# Patient Record
Sex: Male | Born: 1950 | Race: White | Hispanic: No | Marital: Married | Smoking: Never smoker
Health system: Southern US, Community
[De-identification: ages and names within clinical notes are randomized; demographics above are authoritative.]

## PROBLEM LIST (undated history)

## (undated) DIAGNOSIS — N400 Enlarged prostate without lower urinary tract symptoms: Secondary | ICD-10-CM

## (undated) DIAGNOSIS — Z972 Presence of dental prosthetic device (complete) (partial): Secondary | ICD-10-CM

## (undated) HISTORY — PX: PROSTATE BIOPSY: SHX241

---

## 2014-09-15 LAB — CBC WITH DIFFERENTIAL/PLATELET
BASOS PCT: 0.4 %
Basophil #: 0 10*3/uL (ref 0.0–0.1)
EOS PCT: 4 %
Eosinophil #: 0.3 10*3/uL (ref 0.0–0.7)
HCT: 44.6 % (ref 40.0–52.0)
HGB: 14.7 g/dL (ref 13.0–18.0)
LYMPHS ABS: 1.6 10*3/uL (ref 1.0–3.6)
Lymphocyte %: 22.9 %
MCH: 30.1 pg (ref 26.0–34.0)
MCHC: 32.9 g/dL (ref 32.0–36.0)
MCV: 91 fL (ref 80–100)
Monocyte #: 0.5 x10 3/mm (ref 0.2–1.0)
Monocyte %: 7.8 %
Neutrophil #: 4.5 10*3/uL (ref 1.4–6.5)
Neutrophil %: 64.9 %
Platelet: 207 10*3/uL (ref 150–440)
RBC: 4.88 10*6/uL (ref 4.40–5.90)
RDW: 14 % (ref 11.5–14.5)
WBC: 7 10*3/uL (ref 3.8–10.6)

## 2014-09-15 LAB — BASIC METABOLIC PANEL
ANION GAP: 7 (ref 7–16)
BUN: 20 mg/dL
Calcium, Total: 8.7 mg/dL — ABNORMAL LOW
Chloride: 104 mmol/L
Co2: 27 mmol/L
Creatinine: 1.24 mg/dL
EGFR (Non-African Amer.): >60
GLUCOSE: 127 mg/dL — AB
Potassium: 4.2 mmol/L
Sodium: 138 mmol/L

## 2014-09-15 LAB — URINALYSIS, COMPLETE
Bacteria: NONE SEEN
GLUCOSE, UR: NEGATIVE mg/dL (ref 0–75)
Leukocyte Esterase: NEGATIVE
Nitrite: NEGATIVE
Ph: 5 (ref 4.5–8.0)
Protein: NEGATIVE
Specific Gravity: 1.02 (ref 1.003–1.030)
Squamous Epithelial: NONE SEEN

## 2014-09-15 LAB — TROPONIN I

## 2014-09-16 ENCOUNTER — Observation Stay: Admit: 2014-09-16 | Disposition: A | Discharge status: Home / Self Care | Payer: Self-pay | Admitting: Internal Medicine

## 2014-09-16 ENCOUNTER — Encounter: Payer: Self-pay | Admitting: Internal Medicine

## 2014-09-16 ENCOUNTER — Encounter: Payer: Self-pay | Admitting: Emergency Medicine

## 2014-09-16 LAB — CBC WITH DIFFERENTIAL/PLATELET
Basophil #: 0 10*3/uL (ref 0.0–0.1)
Basophil %: 0.5 %
EOS ABS: 0 10*3/uL (ref 0.0–0.7)
Eosinophil %: 0.5 %
HCT: 43.8 % (ref 40.0–52.0)
HGB: 14.2 g/dL (ref 13.0–18.0)
LYMPHS PCT: 17.9 %
Lymphocyte #: 1.3 10*3/uL (ref 1.0–3.6)
MCH: 29.4 pg (ref 26.0–34.0)
MCHC: 32.4 g/dL (ref 32.0–36.0)
MCV: 91 fL (ref 80–100)
MONOS PCT: 5.8 %
Monocyte #: 0.4 x10 3/mm (ref 0.2–1.0)
Neutrophil #: 5.5 10*3/uL (ref 1.4–6.5)
Neutrophil %: 75.3 %
Platelet: 204 10*3/uL (ref 150–440)
RBC: 4.82 10*6/uL (ref 4.40–5.90)
RDW: 13.6 % (ref 11.5–14.5)
WBC: 7.2 10*3/uL (ref 3.8–10.6)

## 2014-09-16 LAB — BASIC METABOLIC PANEL
ANION GAP: 4 — AB (ref 7–16)
BUN: 17 mg/dL
CALCIUM: 8.6 mg/dL — AB
Chloride: 113 mmol/L — ABNORMAL HIGH
Co2: 26 mmol/L
Creatinine: 1.01 mg/dL
EGFR (African American): >60
GLUCOSE: 106 mg/dL — AB
Potassium: 4.6 mmol/L
SODIUM: 143 mmol/L

## 2014-09-16 LAB — HEPATIC FUNCTION PANEL A (ARMC)
ALBUMIN: 4.1 g/dL
ALT: 24 U/L
Alkaline Phosphatase: 68 U/L
Bilirubin, Direct: <0.1 mg/dL
Bilirubin,Total: 0.6 mg/dL
SGOT(AST): 22 U/L
TOTAL PROTEIN: 6.6 g/dL

## 2014-09-16 LAB — TROPONIN I

## 2014-09-24 NOTE — Discharge Summary (Signed)
PATIENT NAME:  Jeffery Wagner, Jeffery Wagner MR#:  [id number] DATE OF BIRTH:  Mar 27, 1951  DATE OF ADMISSION:  09/16/2014 DATE OF DISCHARGE:  09/16/2014  ADMITTING DIAGNOSIS:  Syncope.    DISCHARGE DIAGNOSES:   1. Syncope, suspected due to vasovagal nature; negative carotid Dopplers, echocardiogram, telemetry.   2. History of benign prostatic hypertrophy.  CONSULTANTS:  None.   PERTINENT LABORATORIES AND EVALUATIONS:  Echocardiogram of the heart showed normal EF.  Carotid Dopplers were negative.  Troponin x 3 negative.  WBC 7.0, hemoglobin 14.7, platelet count 207,000.  BMP:  Glucose 127, BUN 20, creatinine 1.24, sodium 138, potassium 4.2, chloride 104, CO2 of 27, calcium 8.7.  HOSPITAL COURSE:  Please refer to H and P done by the admitting physician.  Patient is a 64 year old white male who was admitted after a syncopal episode.  Patient was placed under observation and placed on telemetry.  He presented with episode of syncope.  Patient had a carotid Doppler, echocardiogram, which were nonrevealing.  He did not have any orthostatic changes.  Patient also was monitored on telemetry without any significant abnormality.  At this time, patient is doing better, no further symptoms, stable for discharge.  If he has recurrence of symptoms, he needs a Holter monitor.  DISCHARGE MEDICATIONS:  Flomax 0.4 q. daily.  DIET:  Regular.    ACTIVITY:  As tolerated.  FOLLOWUP:  Follow up with primary MD in 1-2 weeks.    TIME SPENT:  Thirty-five minutes on the discharge.   ____________________________ Lafonda Mosses. Posey Pronto, MD shp:kc D: 09/17/2014 14:08:47 ET T: 09/17/2014 16:07:24 ET JOB#: [id number]  cc: Taiyo Kozma H. Posey Pronto, MD, <Dictator> Alric Seton MD ELECTRONICALLY SIGNED 09/22/2014 14:40

## 2014-09-24 NOTE — H&P (Signed)
PATIENT NAME:  Jeffery Wagner, Jeffery Wagner MR#:  [id number] DATE OF BIRTH:  09-Nov-1950  DATE OF ADMISSION:  09/16/2014  REFERRING PHYSICIAN: Conni Slipper, MD  PRIMARY CARE PHYSICIAN: Loyce Dys, MD out of [facility].  CHIEF COMPLAINT: Passed out.   HISTORY OF PRESENT ILLNESS: A 64 year old Caucasian gentleman with a past medical history of BPH, presenting after a syncopal episode. Describes acute onset of symptoms which happened today upon sitting to a standing position. He felt lightheaded. He walked about  10 feet and then passed out with complete loss of consciousness. No head trauma at that time. Total episode lasted 15 to 30 seconds before regaining consciousness. No tongue biting or loss of bowel or bladder function. No postictal state.  No palpitations, chest pain, or further symptomatology other than nausea, which he sustained for about 4 hours in total. He came to the emergency department for further workup and evaluation. Initial laboratory data including EKG within normal limits. ER staff feel the patient should be observed more closely, thus request hospital admission.   REVIEW OF SYSTEMS:  CONSTITUTIONAL: Denies fevers, chills, fatigue, weakness.  EYES: Denies blurred vision, double vision, eye pain.  EARS, NOSE, THROAT: Denies tinnitus, ear pain, hearing loss.  RESPIRATORY: Denies cough, wheeze, shortness of breath.  CARDIOVASCULAR: Denies chest pain, palpitations, edema.  GASTROINTESTINAL: Positive for nausea. Denies vomiting, diarrhea, abdominal pain.  GENITOURINARY: Denies dysuria or hematuria.  ENDOCRINE: Denies nocturia or thyroid problems.  HEMATOLOGIC AND LYMPHATIC: Denies bruising or bleeding.  SKIN: Denies rash or lesions.  MUSCULOSKELETAL: Denies pain in neck, back, shoulder, knees, hips or arthritic symptoms.  NEUROLOGIC: Denies paralysis, paresthesias.  PSYCHIATRIC: Denies anxiety or depressive symptoms.   Otherwise, full review of systems performed by me was  negative.   PAST MEDICAL HISTORY: Includes BPH.   SOCIAL HISTORY: Denies any alcohol, tobacco, or drug usage.   FAMILY HISTORY: Denies any cardiovascular or pulmonary disorders.   ALLERGIES: No known drug allergies.   HOME MEDICATIONS: Include Flomax 0.4 mg p.o. daily.   PHYSICAL EXAMINATION:  VITAL SIGNS: Temperature 97.7, heart rate 58, respirations 18, blood pressure 134/72, saturating 100% on room air. Orthostatics: Lying heart rate 70, blood pressure 122/76; sitting: 138/79, orthostatic negative. Weight 80.7 kg, BMI 25.3.  GENERAL: Well-nourished, well-developed Caucasian gentleman currently in no acute distress.  HEAD: Normocephalic, atraumatic.  EYES: Pupils equal, round, and reactive to light. Extraocular muscles intact. No scleral icterus.  MOUTH: Moist mucosal membranes. Dentition intact. No abscess noted.  EARS, NOSE, AND THROAT: Clear without exudates. No external lesions.  NECK: Supple. No thyromegaly. No nodules. No JVD.  PULMONARY: Clear to auscultation bilaterally without wheeze, rales, or rhonchi. No use of accessory muscles. Good respiratory effort. CHEST: Nontender to palpation.  CARDIOVASCULAR: S1, S2, regular rate and rhythm. No murmurs, rubs, or gallops. No edema. Pedal pulses 2+ bilaterally.  GASTROINTESTINAL: Soft, nontender, nondistended. No masses. Positive bowel sounds. No hepatosplenomegaly.  MUSCULOSKELETAL: No swelling, clubbing, or edema. Range of motion full in all extremities.  NEUROLOGIC: Cranial nerves II through XII intact. No gross neurological deficits. Sensation intact. Reflexes intact.  SKIN: No ulceration, lesions, rash, or cyanosis. Skin warm and dry. Turgor intact.  PSYCHIATRIC: Mood and affect within normal limits. The patient is awake, alert, oriented x 3. Insight and judgment intact.   LABORATORY DATA: Sodium 138, potassium 4.2, chloride 104, bicarbonate 27, BUN 20, creatinine 1.24, glucose 127. Troponin 0.03. WBC of 7, hemoglobin of 14.7,  platelets of 207,000.   EKG performed. No ST or T wave  abnormalities, normal sinus rhythm.   CT head performed: Slight atrophy. No acute intracranial process.   ASSESSMENT AND PLAN: A 64 year old Caucasian gentleman with history of benign prostatic hypertrophy, presenting after a syncopal episode.  1.  Syncope: Admission under observational status, place on telemetry, provide slight intravenous fluid hydration, trend cardiac enzymes x 3.  2.  Benign prostatic hypertrophy. Continue Flomax.  3.  Venous thromboembolism with heparin subcutaneous.   CODE STATUS: The patient is a FULL CODE.  TIME SPENT: 35 minutes.    ____________________________ Jeffery Mose. Hower, MD dkh:TT D: 09/16/2014 03:30:00 ET T: 09/16/2014 04:35:26 ET JOB#: [id number]  cc: Jeffery Mose. Hower, MD, <Dictator> DAVID Woodfin Ganja MD ELECTRONICALLY SIGNED 09/17/2014 2:47

## 2015-10-14 IMAGING — CT CT HEAD WITHOUT CONTRAST
2 series · 14 of 30 positions shown, 16 images · non-contrast
Comparison: None.

CLINICAL DATA: Syncope.

EXAM:
CT HEAD WITHOUT CONTRAST
TECHNIQUE: Contiguous axial images were obtained from the base of the skull
through the vertex without intravenous contrast.

[Series 2: head wo · axial · 0.43mm/px · z∈[-47,+52]mm · 6 of 32 slices shown, 8 images]
[im 5/32  brain]
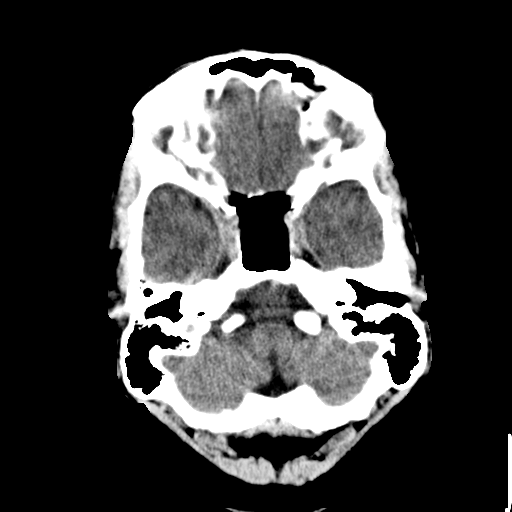
[im 5/32  bone]
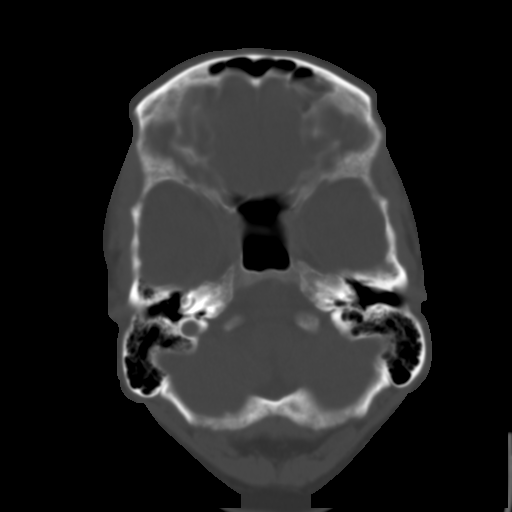
[im 9/32  brain]
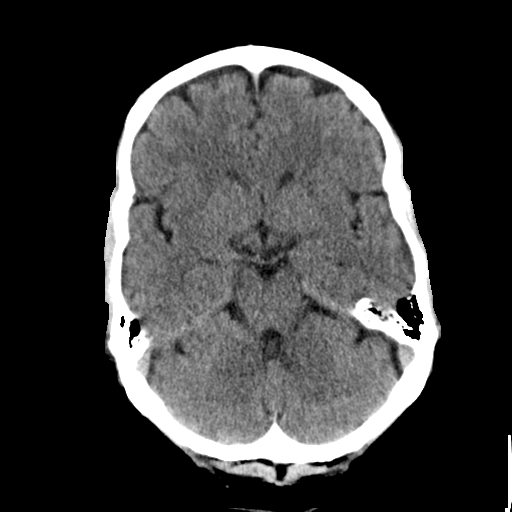
[im 14/32  brain]
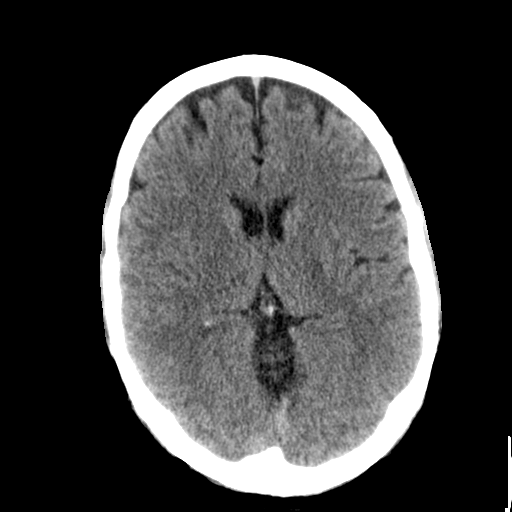
[im 18/32  brain]
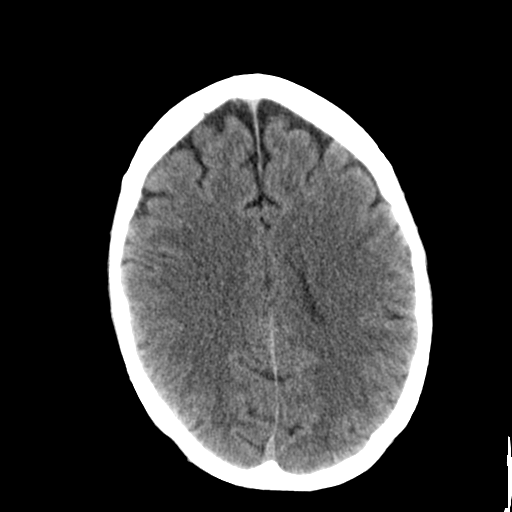
[im 23/32  brain]
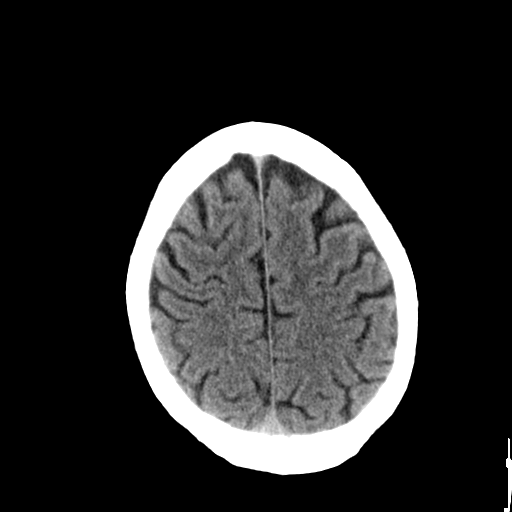
[im 23/32  bone]
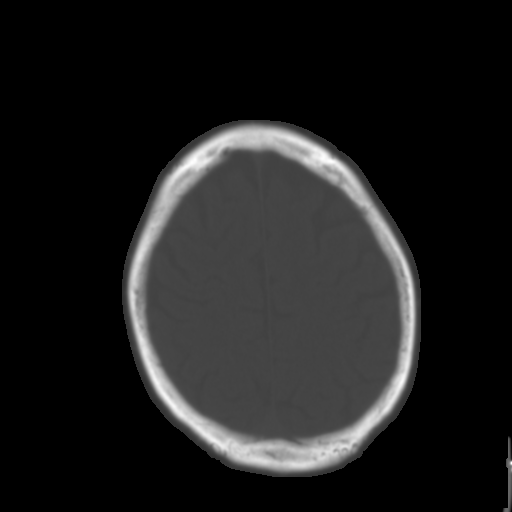
[im 27/32  brain]
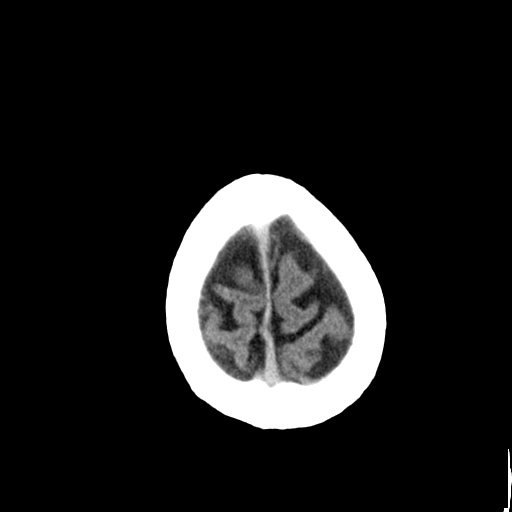

[Series 3: head bone · axial · 0.43mm/px · z∈[-53,+61]mm · 8 of 96 slices shown]
[im 10/96  bone]
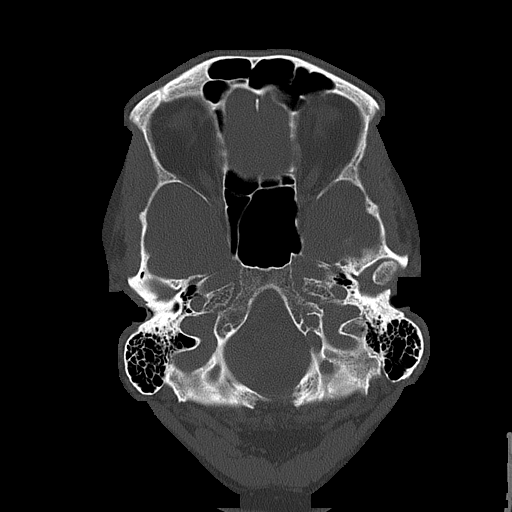
[im 19/96  bone]
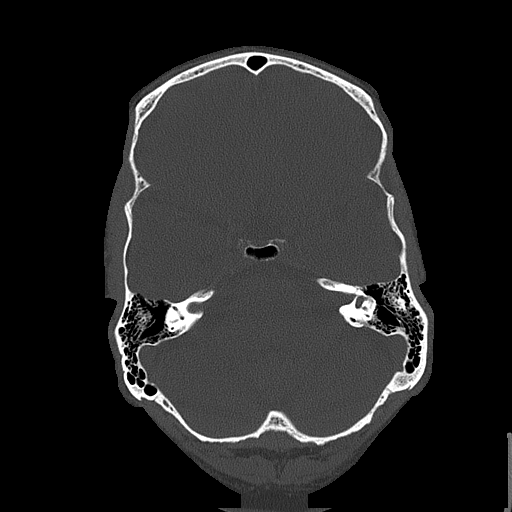
[im 32/96  bone]
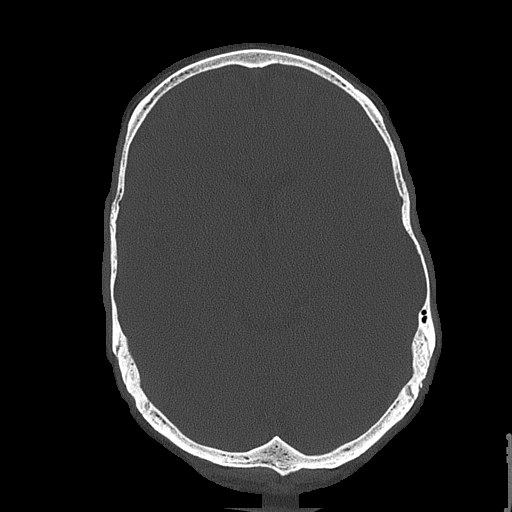
[im 41/96  bone]
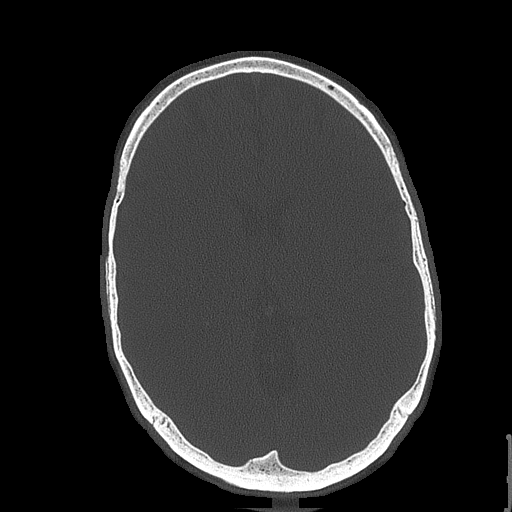
[im 55/96  bone]
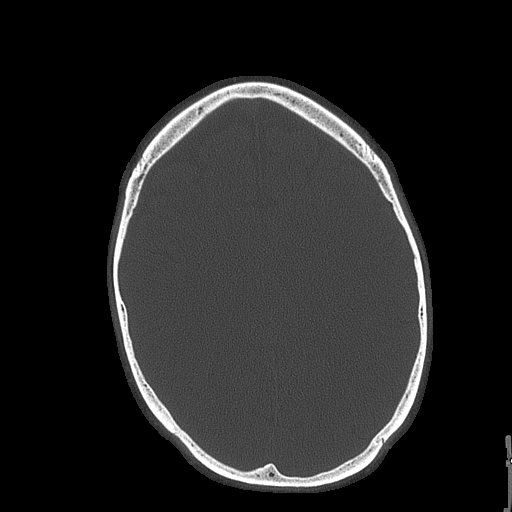
[im 64/96  bone]
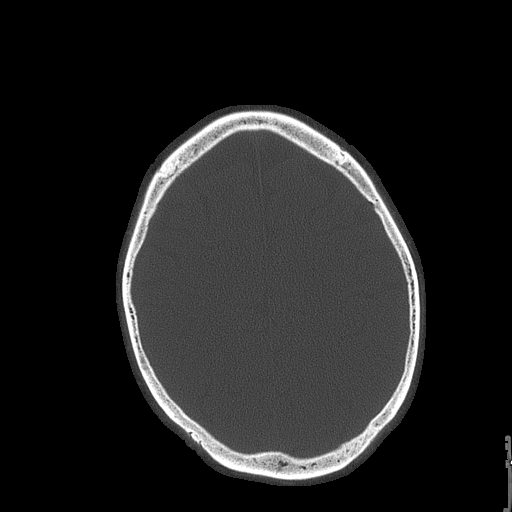
[im 77/96  bone]
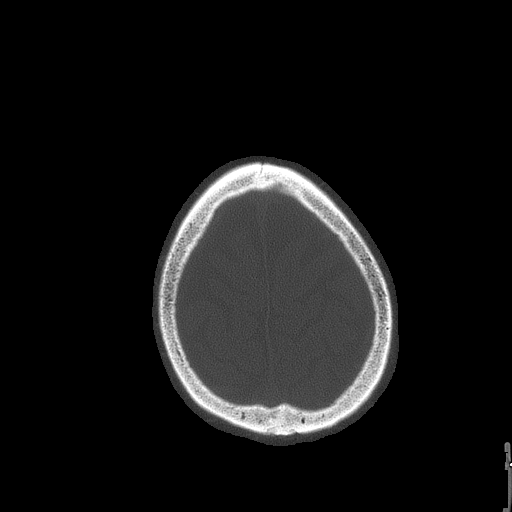
[im 86/96  bone]
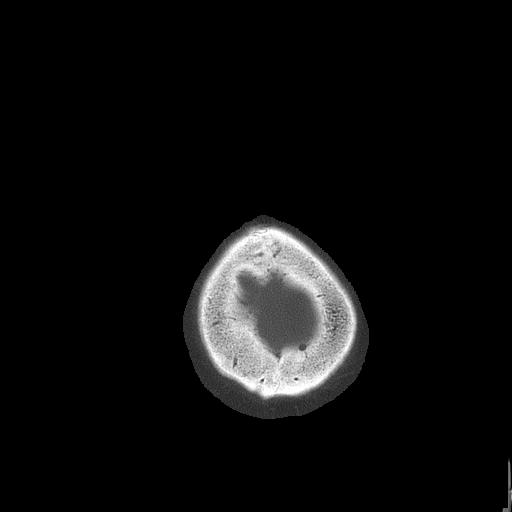

[14 of 30 positions shown; findings below may reference images not displayed]

FINDINGS: No mass lesion. No midline shift. No acute hemorrhage or hematoma.
No extra-axial fluid collections. No evidence of acute infarction.
Mild diffuse atrophy. No ventricular dilatation. Brain parenchyma is
otherwise normal. No osseous abnormality.
IMPRESSION: Slight atrophy.

## 2018-08-02 ENCOUNTER — Telehealth: Payer: Self-pay

## 2018-08-02 ENCOUNTER — Other Ambulatory Visit: Payer: Self-pay

## 2018-08-02 DIAGNOSIS — Z1211 Encounter for screening for malignant neoplasm of colon: Secondary | ICD-10-CM

## 2018-08-02 NOTE — Telephone Encounter (Signed)
Gastroenterology Pre-Procedure Review  Request Date: 08/20/18 Requesting Physician: Dr. Allen Norris  PATIENT REVIEW QUESTIONS: The patient responded to the following health history questions as indicated:    1. Are you having any GI issues? no 2. Do you have a personal history of Polyps? no 3. Do you have a family history of Colon Cancer or Polyps? no 4. Diabetes Mellitus? no 5. Joint replacements in the past 12 months?no 6. Major health problems in the past 3 months?no 7. Any artificial heart valves, MVP, or defibrillator?no    MEDICATIONS & ALLERGIES:    Patient reports the following regarding taking any anticoagulation/antiplatelet therapy:   Plavix, Coumadin, Eliquis, Xarelto, Lovenox, Pradaxa, Brilinta, or Effient? no Aspirin? no  Patient confirms/reports the following medications:  No current outpatient medications on file.   No current facility-administered medications for this visit.     Patient confirms/reports the following allergies:  Allergies not on file  No orders of the defined types were placed in this encounter.   AUTHORIZATION INFORMATION Primary Insurance: 1D#: Group #:  Secondary Insurance: 1D#: Group #:  SCHEDULE INFORMATION: Date: 08/20/18 Time: Location:[facility]

## 2018-08-10 ENCOUNTER — Telehealth: Payer: Self-pay | Admitting: Gastroenterology

## 2018-08-10 NOTE — Telephone Encounter (Signed)
PT LEFT VM TO RESCHEDULE HIS PROCEDURE TO A LATER TIME PLEASE CALL PT

## 2018-08-10 NOTE — Telephone Encounter (Signed)
Returned patients call.  Asked his wife to call me back in regards to rescheduling his 08/20/18 colonoscopy with Dr. Allen Norris at [facility].  Thanks [employer]

## 2018-08-11 ENCOUNTER — Telehealth: Payer: Self-pay

## 2018-08-11 NOTE — Telephone Encounter (Signed)
Patients colonoscopy has been rescheduled from 08/20/18 to 10/20/18 location remains at [facility] with Dr. Candis Shine at [facility] has been notified.  Thanks [employer]

## 2018-09-30 ENCOUNTER — Encounter: Admission: RE | Payer: Self-pay | Source: Home / Self Care

## 2018-09-30 ENCOUNTER — Telehealth: Payer: Self-pay | Admitting: Gastroenterology

## 2018-09-30 ENCOUNTER — Ambulatory Visit: Admission: RE | Admit: 2018-09-30 | Payer: Medicare HMO | Source: Home / Self Care | Admitting: Gastroenterology

## 2018-09-30 DIAGNOSIS — Z01812 Encounter for preprocedural laboratory examination: Secondary | ICD-10-CM

## 2018-09-30 SURGERY — COLONOSCOPY WITH PROPOFOL
Anesthesia: Choice

## 2018-09-30 NOTE — Telephone Encounter (Signed)
Pt is calling he was scheduled for procedure for today and figured it was cancelled but he would like to go ahead and reschedule pt is aware Sharyn Lull will call him from a Private #

## 2018-10-05 ENCOUNTER — Other Ambulatory Visit: Payer: Self-pay

## 2018-10-05 ENCOUNTER — Telehealth: Payer: Self-pay

## 2018-10-05 DIAGNOSIS — Z1211 Encounter for screening for malignant neoplasm of colon: Secondary | ICD-10-CM

## 2018-10-05 NOTE — Telephone Encounter (Signed)
Colonoscopy has been scheduled for June 18th at [facility] with Dr. Allen Norris.  COVID 19 test ordered for the June 15th.  Patient has been advised that he will need to have testing performed 4 days prior to having his colonoscopy at [place] between hours of 10:30 am and 12:30 pm.  Thanks Sharyn Lull

## 2018-11-08 ENCOUNTER — Other Ambulatory Visit: Payer: Self-pay

## 2018-11-08 ENCOUNTER — Other Ambulatory Visit
Admission: RE | Admit: 2018-11-08 | Discharge: 2018-11-08 | Disposition: A | Discharge status: Home / Self Care | Payer: Medicare HMO | Source: Ambulatory Visit | Attending: Gastroenterology | Admitting: Gastroenterology

## 2018-11-08 DIAGNOSIS — Z1159 Encounter for screening for other viral diseases: Secondary | ICD-10-CM | POA: Diagnosis present

## 2018-11-09 LAB — NOVEL CORONAVIRUS, NAA (HOSP ORDER, SEND-OUT TO REF LAB; TAT 18-24 HRS): SARS-CoV-2, NAA: NOT DETECTED

## 2018-11-10 NOTE — Discharge Instructions (Signed)
General Anesthesia, Adult, Care After  This sheet gives you information about how to care for yourself after your procedure. Your health care provider may also give you more specific instructions. If you have problems or questions, contact your health care provider.  What can I expect after the procedure?  After the procedure, the following side effects are common:  Pain or discomfort at the IV site.  Nausea.  Vomiting.  Sore throat.  Trouble concentrating.  Feeling cold or chills.  Weak or tired.  Sleepiness and fatigue.  Soreness and body aches. These side effects can affect parts of the body that were not involved in surgery.  Follow these instructions at home:    For at least 24 hours after the procedure:  Have a responsible adult stay with you. It is important to have someone help care for you until you are awake and alert.  Rest as needed.  Do not:  Participate in activities in which you could fall or become injured.  Drive.  Use heavy machinery.  Drink alcohol.  Take sleeping pills or medicines that cause drowsiness.  Make important decisions or sign legal documents.  Take care of children on your own.  Eating and drinking  Follow any instructions from your health care provider about eating or drinking restrictions.  When you feel hungry, start by eating small amounts of foods that are soft and easy to digest (bland), such as toast. Gradually return to your regular diet.  Drink enough fluid to keep your urine pale yellow.  If you vomit, rehydrate by drinking water, juice, or clear broth.  General instructions  If you have sleep apnea, surgery and certain medicines can increase your risk for breathing problems. Follow instructions from your health care provider about wearing your sleep device:  Anytime you are sleeping, including during daytime naps.  While taking prescription pain medicines, sleeping medicines, or medicines that make you drowsy.  Return to your normal activities as told by your health care  provider. Ask your health care provider what activities are safe for you.  Take over-the-counter and prescription medicines only as told by your health care provider.  If you smoke, do not smoke without supervision.  Keep all follow-up visits as told by your health care provider. This is important.  Contact a health care provider if:  You have nausea or vomiting that does not get better with medicine.  You cannot eat or drink without vomiting.  You have pain that does not get better with medicine.  You are unable to pass urine.  You develop a skin rash.  You have a fever.  You have redness around your IV site that gets worse.  Get help right away if:  You have difficulty breathing.  You have chest pain.  You have blood in your urine or stool, or you vomit blood.  Summary  After the procedure, it is common to have a sore throat or nausea. It is also common to feel tired.  Have a responsible adult stay with you for the first 24 hours after general anesthesia. It is important to have someone help care for you until you are awake and alert.  When you feel hungry, start by eating small amounts of foods that are soft and easy to digest (bland), such as toast. Gradually return to your regular diet.  Drink enough fluid to keep your urine pale yellow.  Return to your normal activities as told by your health care provider. Ask your health care   provider what activities are safe for you.  This information is not intended to replace advice given to you by your health care provider. Make sure you discuss any questions you have with your health care provider.  Document Released: 08/18/2000 Document Revised: 12/26/2016 Document Reviewed: 12/26/2016  Elsevier Interactive Patient Education  2019 [employer]

## 2018-11-11 ENCOUNTER — Ambulatory Visit: Payer: Medicare HMO | Admitting: Anesthesiology

## 2018-11-11 ENCOUNTER — Ambulatory Visit
Admission: RE | Admit: 2018-11-11 | Discharge: 2018-11-11 | Disposition: A | Discharge status: Home / Self Care | Payer: Medicare HMO | Attending: Gastroenterology | Admitting: Gastroenterology

## 2018-11-11 ENCOUNTER — Other Ambulatory Visit: Payer: Self-pay

## 2018-11-11 ENCOUNTER — Encounter
Admission: RE | Disposition: A | Discharge status: Home / Self Care | Payer: Self-pay | Source: Home / Self Care | Attending: Gastroenterology

## 2018-11-11 DIAGNOSIS — Z1211 Encounter for screening for malignant neoplasm of colon: Secondary | ICD-10-CM

## 2018-11-11 DIAGNOSIS — Z79899 Other long term (current) drug therapy: Secondary | ICD-10-CM | POA: Diagnosis not present

## 2018-11-11 DIAGNOSIS — K635 Polyp of colon: Secondary | ICD-10-CM

## 2018-11-11 DIAGNOSIS — D122 Benign neoplasm of ascending colon: Secondary | ICD-10-CM

## 2018-11-11 HISTORY — PX: POLYPECTOMY: SHX5525

## 2018-11-11 HISTORY — DX: Presence of dental prosthetic device (complete) (partial): Z97.2

## 2018-11-11 HISTORY — PX: COLONOSCOPY WITH PROPOFOL: SHX5780

## 2018-11-11 SURGERY — COLONOSCOPY WITH PROPOFOL
Anesthesia: General | Site: Rectum

## 2018-11-11 MED ORDER — SODIUM CHLORIDE 0.9 % IV SOLN: INTRAVENOUS | Status: DC

## 2018-11-11 MED ORDER — LACTATED RINGERS IV SOLN
INTRAVENOUS | Status: DC
  Administered 2018-11-11: 08:00:00 via INTRAVENOUS

## 2018-11-11 MED ORDER — STERILE WATER FOR IRRIGATION IR SOLN
Status: DC | PRN
  Administered 2018-11-11: 15 mL

## 2018-11-11 MED ORDER — LIDOCAINE HCL (CARDIAC) PF 100 MG/5ML IV SOSY
PREFILLED_SYRINGE | INTRAVENOUS | Status: DC | PRN
  Administered 2018-11-11: 40 mg via INTRAVENOUS

## 2018-11-11 MED ORDER — PROPOFOL 10 MG/ML IV BOLUS
INTRAVENOUS | Status: DC | PRN
  Administered 2018-11-11 (×2): 20 mg via INTRAVENOUS
  Administered 2018-11-11 (×2): 40 mg via INTRAVENOUS
  Administered 2018-11-11: 10 mg via INTRAVENOUS
  Administered 2018-11-11: 20 mg via INTRAVENOUS
  Administered 2018-11-11: 30 mg via INTRAVENOUS
  Administered 2018-11-11: 70 mg via INTRAVENOUS
  Administered 2018-11-11: 20 mg via INTRAVENOUS

## 2018-11-11 MED ORDER — ONDANSETRON HCL 4 MG/2ML IJ SOLN: 4.0000 mg | Freq: Once | INTRAMUSCULAR | Status: DC | PRN

## 2018-11-11 SURGICAL SUPPLY — 7 items
CANISTER SUCT 1200ML W/VALVE (MISCELLANEOUS) ×3 IMPLANT
GOWN CVR UNV OPN BCK APRN NK (MISCELLANEOUS) ×2 IMPLANT
GOWN ISOL THUMB LOOP REG UNIV (MISCELLANEOUS) ×4
KIT ENDO PROCEDURE OLY (KITS) ×3 IMPLANT
SNARE SHORT THROW 13M SML OVAL (MISCELLANEOUS) ×3 IMPLANT
TRAP ETRAP POLY (MISCELLANEOUS) ×3 IMPLANT
WATER STERILE IRR 250ML POUR (IV SOLUTION) ×3 IMPLANT

## 2018-11-11 NOTE — Anesthesia Procedure Notes (Signed)
Procedure Name: MAC Date/Time: 11/11/2018 8:21 AM Performed by: Janna Arch, CRNA Pre-anesthesia Checklist: Patient identified, Emergency Drugs available, Suction available, Timeout performed and Patient being monitored Patient Re-evaluated:Patient Re-evaluated prior to induction Oxygen Delivery Method: Nasal cannula Placement Confirmation: positive ETCO2

## 2018-11-11 NOTE — Anesthesia Postprocedure Evaluation (Signed)
Anesthesia Post Note  Patient: Jeffery Wagner  Procedure(s) Performed: COLONOSCOPY WITH BIOPSY (N/A Rectum) POLYPECTOMY (N/A Rectum)  Patient location during evaluation: PACU Anesthesia Type: General Level of consciousness: awake Pain management: pain level controlled Vital Signs Assessment: post-procedure vital signs reviewed and stable Respiratory status: respiratory function stable Cardiovascular status: stable Postop Assessment: no signs of nausea or vomiting Anesthetic complications: no    Veda Canning

## 2018-11-11 NOTE — Anesthesia Preprocedure Evaluation (Addendum)
Anesthesia Evaluation  Patient identified by MRN, date of birth, ID band Patient awake    Reviewed: Allergy & Precautions, NPO status , Patient's Chart, lab work & pertinent test results  Airway Mallampati: II  TM Distance: >3 FB     Dental   Pulmonary neg pulmonary ROS,    breath sounds clear to auscultation       Cardiovascular negative cardio ROS   Rhythm:Regular Rate:Normal     Neuro/Psych    GI/Hepatic negative GI ROS,   Endo/Other  negative endocrine ROS  Renal/GU      Musculoskeletal   Abdominal   Peds  Hematology   Anesthesia Other Findings   Reproductive/Obstetrics                            Anesthesia Physical Anesthesia Plan  ASA: I  Anesthesia Plan: General   Post-op Pain Management:    Induction: Intravenous  PONV Risk Score and Plan:   Airway Management Planned: Natural Airway and Nasal Cannula  Additional Equipment:   Intra-op Plan:   Post-operative Plan:   Informed Consent: I have reviewed the patients History and Physical, chart, labs and discussed the procedure including the risks, benefits and alternatives for the proposed anesthesia with the patient or authorized representative who has indicated his/her understanding and acceptance.       Plan Discussed with: CRNA  Anesthesia Plan Comments:        Anesthesia Quick Evaluation

## 2018-11-11 NOTE — Op Note (Signed)
[facility] Patient Name: Jeffery Wagner Procedure Date: 11/11/2018 8:12 AM MRN: [id number] Account #: [id number] Date of Birth: 1950/07/10 Admit Type: Outpatient Age: 68 Room: [facility] OR ROOM 01 Gender: Male Note Status: Finalized Procedure:            Colonoscopy Indications:          Screening for colorectal malignant neoplasm Providers:            Lucilla Lame MD, MD Referring MD:         Sofie Hartigan (Referring MD) Medicines:            Propofol per Anesthesia Complications:        No immediate complications. Procedure:            Pre-Anesthesia Assessment:                       - Prior to the procedure, a History and Physical was                        performed, and patient medications and allergies were                        reviewed. The patient's tolerance of previous                        anesthesia was also reviewed. The risks and benefits of                        the procedure and the sedation options and risks were                        discussed with the patient. All questions were                        answered, and informed consent was obtained. Prior                        Anticoagulants: The patient has taken no previous                        anticoagulant or antiplatelet agents. ASA Grade                        Assessment: II - A patient with mild systemic disease.                        After reviewing the risks and benefits, the patient was                        deemed in satisfactory condition to undergo the                        procedure.                       After obtaining informed consent, the colonoscope was                        passed under direct vision. Throughout the procedure,  the patient's blood pressure, pulse, and oxygen                        saturations were monitored continuously. The was                        introduced through the anus and advanced to the the                  cecum, identified by appendiceal orifice and ileocecal                        valve. The colonoscopy was performed without                        difficulty. The patient tolerated the procedure well.                        The quality of the bowel preparation was fair. Findings:      The perianal and digital rectal examinations were normal.      A 4 mm polyp was found in the ascending colon. The polyp was sessile.       The polyp was removed with a cold snare. Resection and retrieval were       complete.      Non-bleeding internal hemorrhoids were found during retroflexion. The       hemorrhoids were Grade II (internal hemorrhoids that prolapse but reduce       spontaneously). Impression:           - Preparation of the colon was fair.                       - One 4 mm polyp in the ascending colon, removed with a                        cold snare. Resected and retrieved.                       - Non-bleeding internal hemorrhoids. Recommendation:       - Discharge patient to home.                       - Resume previous diet.                       - Continue present medications.                       - Await pathology results.                       - Repeat colonoscopy in 5 years if polyp adenoma and 10                        years if hyperplastic Procedure Code(s):    --- Professional ---                       [phone], Colonoscopy, flexible; with removal of tumor(s),                        polyp(s), or other lesion(s) by snare technique Diagnosis Code(s):    ---  Professional ---                       Z12.11, Encounter for screening for malignant neoplasm                        of colon                       K63.5, Polyp of colon CPT copyright 2019 American Medical Association. All rights reserved. The codes documented in this report are preliminary and upon coder review may  be revised to meet current compliance requirements. Lucilla Lame MD, MD 11/11/2018 8:39:43 AM This  report has been signed electronically. Number of Addenda: 0 Note Initiated On: 11/11/2018 8:12 AM Scope Withdrawal Time: 0 hours 7 minutes 42 seconds  Total Procedure Duration: 0 hours 12 minutes 13 seconds  Estimated Blood Loss: Estimated blood loss: none.      [facility]

## 2018-11-11 NOTE — Transfer of Care (Signed)
Immediate Anesthesia Transfer of Care Note  Patient: Jeffery Wagner  Procedure(s) Performed: COLONOSCOPY WITH BIOPSY (N/A Rectum) POLYPECTOMY (N/A Rectum)  Patient Location: PACU  Anesthesia Type: General  Level of Consciousness: awake, alert  and patient cooperative  Airway and Oxygen Therapy: Patient Spontanous Breathing and Patient connected to supplemental oxygen  Post-op Assessment: Post-op Vital signs reviewed, Patient's Cardiovascular Status Stable, Respiratory Function Stable, Patent Airway and No signs of Nausea or vomiting  Post-op Vital Signs: Reviewed and stable  Complications: No apparent anesthesia complications

## 2018-11-11 NOTE — H&P (Signed)
   Lucilla Lame, MD [address] [place], [place] [address] Phone: [phone] Fax : [phone]  Primary Care Physician:  Sofie Hartigan, MD Primary Gastroenterologist:  Dr. Allen Norris  Pre-Procedure History & Physical: HPI:  Jeffery Wagner is a 68 y.o. male is here for a screening colonoscopy.   Past Medical History:  Diagnosis Date  . Wears partial dentures    upper    Past Surgical History:  Procedure Laterality Date  . PROSTATE BIOPSY     x 4    Prior to Admission medications   Medication Sig Start Date End Date Taking? Authorizing Provider  Multiple Vitamin (MULTIVITAMIN) tablet Take 1 tablet by mouth daily.   Yes [provider]  tamsulosin (FLOMAX) 0.4 MG CAPS capsule Take 0.4 mg by mouth daily after supper.   Yes [provider]    Allergies as of 10/05/2018  . (Not on File)    History reviewed. No pertinent family history.  Social History   Socioeconomic History  . Marital status: Married    Spouse name: Not on file  . Number of children: Not on file  . Years of education: Not on file  . Highest education level: Not on file  Occupational History  . Not on file  Social Needs  . Financial resource strain: Not on file  . Food insecurity    Worry: Not on file    Inability: Not on file  . Transportation needs    Medical: Not on file    Non-medical: Not on file  Tobacco Use  . Smoking status: Never Smoker  . Smokeless tobacco: Never Used  Substance and Sexual Activity  . Alcohol use: Never    Frequency: Never  . Drug use: Not on file  . Sexual activity: Not on file  Lifestyle  . Physical activity    Days per week: Not on file    Minutes per session: Not on file  . Stress: Not on file  Relationships  . Social Herbalist on phone: Not on file    Gets together: Not on file    Attends religious service: Not on file    Active member of club or organization: Not on file    Attends meetings of clubs or  organizations: Not on file    Relationship status: Not on file  . Intimate partner violence    Fear of current or ex partner: Not on file    Emotionally abused: Not on file    Physically abused: Not on file    Forced sexual activity: Not on file  Other Topics Concern  . Not on file  Social History Narrative  . Not on file    Review of Systems: See HPI, otherwise negative ROS  Physical Exam: Ht 5\' 10"  (1.778 m)   Wt 67.1 kg   BMI 21.24 kg/m  General:   Alert,  pleasant and cooperative in NAD Head:  Normocephalic and atraumatic. Neck:  Supple; no masses or thyromegaly. Lungs:  Clear throughout to auscultation.    Heart:  Regular rate and rhythm. Abdomen:  Soft, nontender and nondistended. Normal bowel sounds, without guarding, and without rebound.   Neurologic:  Alert and  oriented x4;  grossly normal neurologically.  Impression/Plan: Jeffery Wagner is now here to undergo a screening colonoscopy.  Risks, benefits, and alternatives regarding colonoscopy have been reviewed with the patient.  Questions have been answered.  All parties agreeable.

## 2018-11-12 ENCOUNTER — Encounter: Payer: Self-pay | Admitting: Gastroenterology

## 2018-11-18 ENCOUNTER — Encounter: Payer: Self-pay | Admitting: Gastroenterology

## 2018-11-19 ENCOUNTER — Encounter: Payer: Self-pay | Admitting: Gastroenterology

## 2019-11-11 ENCOUNTER — Emergency Department: Payer: Medicare HMO

## 2019-11-11 ENCOUNTER — Other Ambulatory Visit: Payer: Self-pay

## 2019-11-11 ENCOUNTER — Emergency Department
Admission: EM | Admit: 2019-11-11 | Discharge: 2019-11-11 | Disposition: A | Discharge status: Home / Self Care | Payer: Medicare HMO | Attending: Student in an Organized Health Care Education/Training Program | Admitting: Student in an Organized Health Care Education/Training Program

## 2019-11-11 ENCOUNTER — Encounter: Payer: Self-pay | Admitting: Emergency Medicine

## 2019-11-11 DIAGNOSIS — R1013 Epigastric pain: Secondary | ICD-10-CM | POA: Insufficient documentation

## 2019-11-11 DIAGNOSIS — R110 Nausea: Secondary | ICD-10-CM | POA: Diagnosis present

## 2019-11-11 DIAGNOSIS — Z79899 Other long term (current) drug therapy: Secondary | ICD-10-CM | POA: Insufficient documentation

## 2019-11-11 DIAGNOSIS — R55 Syncope and collapse: Secondary | ICD-10-CM | POA: Diagnosis not present

## 2019-11-11 HISTORY — DX: Benign prostatic hyperplasia without lower urinary tract symptoms: N40.0

## 2019-11-11 LAB — TROPONIN I (HIGH SENSITIVITY)
Troponin I (High Sensitivity): <2 ng/L (ref <18)
Troponin I (High Sensitivity): <2 ng/L (ref <18)

## 2019-11-11 LAB — COMPREHENSIVE METABOLIC PANEL
ALT: 21 U/L (ref 0–44)
AST: 25 U/L (ref 15–41)
Albumin: 4.6 g/dL (ref 3.5–5.0)
Alkaline Phosphatase: 59 U/L (ref 38–126)
Anion gap: 11 (ref 5–15)
BUN: 15 mg/dL (ref 8–23)
CO2: 27 mmol/L (ref 22–32)
Calcium: 9.5 mg/dL (ref 8.9–10.3)
Chloride: 103 mmol/L (ref 98–111)
Creatinine, Ser: 1.18 mg/dL (ref 0.61–1.24)
GFR calc Af Amer: >60 mL/min (ref >60)
GFR calc non Af Amer: >60 mL/min (ref >60)
Glucose, Bld: 148 mg/dL — ABNORMAL HIGH (ref 70–99)
Potassium: 4.4 mmol/L (ref 3.5–5.1)
Sodium: 141 mmol/L (ref 135–145)
Total Bilirubin: 1.2 mg/dL (ref 0.3–1.2)
Total Protein: 6.9 g/dL (ref 6.5–8.1)

## 2019-11-11 LAB — CBC
HCT: 48.7 % (ref 39.0–52.0)
Hemoglobin: 16.4 g/dL (ref 13.0–17.0)
MCH: 30.4 pg (ref 26.0–34.0)
MCHC: 33.7 g/dL (ref 30.0–36.0)
MCV: 90.4 fL (ref 80.0–100.0)
Platelets: 203 10*3/uL (ref 150–400)
RBC: 5.39 MIL/uL (ref 4.22–5.81)
RDW: 13.4 % (ref 11.5–15.5)
WBC: 8.3 10*3/uL (ref 4.0–10.5)
nRBC: 0 % (ref 0.0–0.2)

## 2019-11-11 LAB — LIPASE, BLOOD: Lipase: 24 U/L (ref 11–51)

## 2019-11-11 LAB — URINALYSIS, COMPLETE (UACMP) WITH MICROSCOPIC
Bacteria, UA: NONE SEEN
Bilirubin Urine: NEGATIVE
Glucose, UA: NEGATIVE mg/dL
Hgb urine dipstick: NEGATIVE
Ketones, ur: 5 mg/dL — AB
Leukocytes,Ua: NEGATIVE
Nitrite: NEGATIVE
Protein, ur: 100 mg/dL — AB
Specific Gravity, Urine: 1.018 (ref 1.005–1.030)
Squamous Epithelial / HPF: NONE SEEN (ref 0–5)
WBC, UA: NONE SEEN WBC/hpf (ref 0–5)
pH: 9 — ABNORMAL HIGH (ref 5.0–8.0)

## 2019-11-11 MED ORDER — SODIUM CHLORIDE 0.9 % IV BOLUS
1000.0000 mL | Freq: Once | INTRAVENOUS | Status: AC
  Administered 2019-11-11: 1000 mL via INTRAVENOUS

## 2019-11-11 MED ORDER — SODIUM CHLORIDE 0.9% FLUSH: 3.0000 mL | Freq: Once | INTRAVENOUS | Status: DC

## 2019-11-11 NOTE — ED Notes (Signed)
See triage note, pt reports nausea and diarrhea along with syncopal episode when getting up to go to restroom this morning.  Pt alert and oriented, NAD noted.  Pt provided crackers and ice water, ok per Dr Quentin Cornwall

## 2019-11-11 NOTE — ED Notes (Signed)
Lab contacted to add on first troponin to bloodwork from 0717 this morning. 2nd troponin sent.

## 2019-11-11 NOTE — ED Notes (Signed)
Pt ambulatory to restroom at this time, steady gait, NAD noted 

## 2019-11-11 NOTE — ED Notes (Signed)
Pt ambulatory, steady gait noted

## 2019-11-11 NOTE — ED Provider Notes (Signed)
[facility] Emergency Department Provider Note    First MD Initiated Contact with Patient 11/11/19 1159     (approximate)  I have reviewed the triage vital signs and the nursing notes.   HISTORY  Chief Complaint Abdominal Pain and Loss of Consciousness    HPI Jeffery Wagner is a 69 y.o. male the below listed past medical history presents to the ER for an episode of nausea, "stomach upset "and syncopal episode that occurred this morning.  Patient states that last night he did not have as much to eat because his wife was prepping for colonoscopy this morning he felt bad about eating in front of her.  States he did perform extra duties in his yard more than usual.  States that he was feeling nauseated and some discomfort this morning.  He woke up ate a bowl of cereal states that he ate it very quickly and then started feeling nauseated with some epigastric discomfort.  States he got himself up to go to the bathroom and then felt like he is about to faint.  He did fall to ground but did not strike his head.  States that EMS was called.  He was given antiemetic as well as IV fluids and feels much better right now.  States he has a history of syncopal episodes and has had a Holter monitor and cardiac work-up all of which which was reassuring.  He denies any chest pain or pressure.  Denies any abdominal pain.  No nausea or vomiting. Denies any headache, blurry vision, numbness, tingling or weakness.  No diarrhea, melena or hematochezia.  Feeling better already after fluids and drinking water.   Past Medical History:  Diagnosis Date  . BPH (benign prostatic hyperplasia)   . Wears partial dentures    upper   No family history on file. Past Surgical History:  Procedure Laterality Date  . COLONOSCOPY WITH PROPOFOL N/A 11/11/2018   Procedure: COLONOSCOPY WITH BIOPSY;  Surgeon: Lucilla Lame, MD;  Location: [facility];  Service: Endoscopy;  Laterality: N/A;  .  POLYPECTOMY N/A 11/11/2018   Procedure: POLYPECTOMY;  Surgeon: Lucilla Lame, MD;  Location: [facility];  Service: Endoscopy;  Laterality: N/A;  . PROSTATE BIOPSY     x 4   Patient Active Problem List   Diagnosis Date Noted  . Encounter for screening colonoscopy   . Polyp of ascending colon       Prior to Admission medications   Medication Sig Start Date End Date Taking? Authorizing Provider  Multiple Vitamin (MULTIVITAMIN) tablet Take 1 tablet by mouth daily.    [provider]  tamsulosin (FLOMAX) 0.4 MG CAPS capsule Take 0.4 mg by mouth daily after supper.    [provider]    Allergies Patient has no known allergies.    Social History Social History   Tobacco Use  . Smoking status: Never Smoker  . Smokeless tobacco: Never Used  Substance Use Topics  . Alcohol use: Never  . Drug use: Not Currently    Review of Systems Patient denies headaches, rhinorrhea, blurry vision, numbness, shortness of breath, chest pain, edema, cough, abdominal pain, nausea, vomiting, diarrhea, dysuria, fevers, rashes or hallucinations unless otherwise stated above in HPI. ____________________________________________   PHYSICAL EXAM:  VITAL SIGNS: Vitals:   11/11/19 1400 11/11/19 1530  BP: 132/77 116/69  Pulse: (!) 55 (!) 59  Resp: 16 14  Temp:    SpO2: 92% 100%    Constitutional: Alert and oriented.  Eyes: Conjunctivae are normal.  Head: Atraumatic. Nose: No congestion/rhinnorhea. Mouth/Throat: Mucous membranes are moist.   Neck: No stridor. Painless ROM.  Cardiovascular: Normal rate, regular rhythm. Grossly normal heart sounds.  Good peripheral circulation. Respiratory: Normal respiratory effort.  No retractions. Lungs CTAB. Gastrointestinal: Soft and nontender. No distention. No abdominal bruits. No CVA tenderness. Genitourinary:  Musculoskeletal: No lower extremity tenderness nor edema.  No joint effusions. Neurologic:  CN- intact.  No facial  droop, Normal FNF.  Normal heel to shin.  Sensation intact bilaterally. Normal speech and language. No gross focal neurologic deficits are appreciated. No gait instability. Skin:  Skin is warm, dry and intact. No rash noted. Psychiatric: Mood and affect are normal. Speech and behavior are normal.  ____________________________________________   LABS (all labs ordered are listed, but only abnormal results are displayed)  Results for orders placed or performed during the hospital encounter of 11/11/19 (from the past 24 hour(s))  Lipase, blood     Status: None   Collection Time: 11/11/19  7:17 AM  Result Value Ref Range   Lipase 24 11 - 51 U/L  Comprehensive metabolic panel     Status: Abnormal   Collection Time: 11/11/19  7:17 AM  Result Value Ref Range   Sodium 141 135 - 145 mmol/L   Potassium 4.4 3.5 - 5.1 mmol/L   Chloride 103 98 - 111 mmol/L   CO2 27 22 - 32 mmol/L   Glucose, Bld 148 (H) 70 - 99 mg/dL   BUN 15 8 - 23 mg/dL   Creatinine, Ser 1.18 0.61 - 1.24 mg/dL   Calcium 9.5 8.9 - 10.3 mg/dL   Total Protein 6.9 6.5 - 8.1 g/dL   Albumin 4.6 3.5 - 5.0 g/dL   AST 25 15 - 41 U/L   ALT 21 0 - 44 U/L   Alkaline Phosphatase 59 38 - 126 U/L   Total Bilirubin 1.2 0.3 - 1.2 mg/dL   GFR calc non Af Amer >60 >60 mL/min   GFR calc Af Amer >60 >60 mL/min   Anion gap 11 5 - 15  CBC     Status: None   Collection Time: 11/11/19  7:17 AM  Result Value Ref Range   WBC 8.3 4.0 - 10.5 K/uL   RBC 5.39 4.22 - 5.81 MIL/uL   Hemoglobin 16.4 13.0 - 17.0 g/dL   HCT 48.7 39 - 52 %   MCV 90.4 80.0 - 100.0 fL   MCH 30.4 26.0 - 34.0 pg   MCHC 33.7 30.0 - 36.0 g/dL   RDW 13.4 11.5 - 15.5 %   Platelets 203 150 - 400 K/uL   nRBC 0.0 0.0 - 0.2 %  Urinalysis, Complete w Microscopic     Status: Abnormal   Collection Time: 11/11/19  7:17 AM  Result Value Ref Range   Color, Urine YELLOW (A) YELLOW   APPearance TURBID (A) CLEAR   Specific Gravity, Urine 1.018 1.005 - 1.030   pH 9.0 (H) 5.0 - 8.0    Glucose, UA NEGATIVE NEGATIVE mg/dL   Hgb urine dipstick NEGATIVE NEGATIVE   Bilirubin Urine NEGATIVE NEGATIVE   Ketones, ur 5 (A) NEGATIVE mg/dL   Protein, ur 100 (A) NEGATIVE mg/dL   Nitrite NEGATIVE NEGATIVE   Leukocytes,Ua NEGATIVE NEGATIVE   WBC, UA NONE SEEN 0 - 5 WBC/hpf   Bacteria, UA NONE SEEN NONE SEEN   Squamous Epithelial / LPF NONE SEEN 0 - 5   Mucus PRESENT    Amorphous Crystal PRESENT  Troponin I (High Sensitivity)     Status: None   Collection Time: 11/11/19  7:17 AM  Result Value Ref Range   Troponin I (High Sensitivity) <2 <18 ng/L  Troponin I (High Sensitivity)     Status: None   Collection Time: 11/11/19 12:19 PM  Result Value Ref Range   Troponin I (High Sensitivity) <2 <18 ng/L   ____________________________________________  EKG My review and personal interpretation at Time: 7:22   Indication: syncope  Rate: 65  Rhythm: sinus Axis: normal Other:  Nonspecific st abn, subtle pr depressions ____________________________________________  RADIOLOGY    EMERGENCY DEPARTMENT [place] CARDIAC EXAM "Study: Limited Ultrasound of the Heart and Pericardium"  INDICATIONS:syncope Multiple views of the heart and pericardium were obtained in real-time with a multi-frequency probe.  PERFORMED XL:[id number] IMAGES ARCHIVED?: No LIMITATIONS:  None VIEWS USED: Subcostal 4 chamber and Parasternal long axis INTERPRETATION: Cardiac activity present, Pericardial effusioin absent and Normal contractility    I personally reviewed all radiographic images ordered to evaluate for the above acute complaints and reviewed radiology reports and findings.  These findings were personally discussed with the patient.  Please see medical record for radiology report.  ____________________________________________   PROCEDURES  Procedure(s) performed:  Procedures    Critical Care performed: no ____________________________________________   INITIAL IMPRESSION / ASSESSMENT AND PLAN / ED  COURSE  Pertinent labs & imaging results that were available during my care of the patient were reviewed by me and considered in my medical decision making (see chart for details).   DDX: dehydration, electrolyte abn, anemia, dysrhythmia, gastritis, enteritis, vasovagal, orthostasis  Kaydence Baba is a 69 y.o. who presents to the ED with symptoms as described above.  Patient clinically exceedingly well-appearing at this time.  History sounds consistent with an episode of likely dehydration versus vasovagal episode.  His abdominal exam is soft benign he denies any nausea or abdominal pain.  His neuro exam is reassuring denies any headache or head injury.  Denies any chest pain given his age and risk factors will observe in the ER for serial exams as well as ensure that serial enzymes are negative to rule out cardiac etiology.  Will give IV fluids and encourage p.o. intake.  Clinical Course as of Nov 10 1545  Fri Nov 11, 2019  1358 Repeat abdominal exam is soft benign.  Feels well and asymptomatic at this time.  Have a lower suspicion for acute intra-abdominal process given his benign abdomen and no white count and reassuring blood work.  Does not seem consistent with CNS abnormality does not sound like it was seizure activity.  Suspect dehydration possible vasovagal.  Receiving IV fluids we will continue observe.  He is tolerating p.o.   [PR]  2725 Patient reassessed.  Again feels well.  Discussed CT imaging but patient declining any further testing as he feels well and doesn't feel any further testing necessary referring to similar episodes in the past.  His exam remains reassuring he is nontoxic.  No deficits.  Does appear clinically stable appropriate for outpatient follow-up.   [PR]    Clinical Course User Index [PR] Merlyn Lot, MD    The patient was evaluated in Emergency Department today for the symptoms described in the history of present illness. He/she was evaluated in the  context of the global COVID-19 pandemic, which necessitated consideration that the patient might be at risk for infection with the SARS-CoV-2 virus that causes COVID-19. Institutional protocols and algorithms that pertain to the evaluation of patients at  risk for COVID-19 are in a state of rapid change based on information released by regulatory bodies including the CDC and federal and state organizations. These policies and algorithms were followed during the patient's care in the ED.  As part of my medical decision making, I reviewed the following data within the [facility] notes reviewed and incorporated, Labs reviewed, notes from prior ED visits and [place] Controlled Substance Database   ____________________________________________   FINAL CLINICAL IMPRESSION(S) / ED DIAGNOSES  Final diagnoses:  Syncope and collapse      NEW MEDICATIONS STARTED DURING THIS VISIT:  New Prescriptions   No medications on file     Note:  This document was prepared using Dragon voice recognition software and may include unintentional dictation errors.    Merlyn Lot, MD 11/11/19 1547

## 2019-11-11 NOTE — ED Notes (Signed)
Dr Robinson at bedside 

## 2019-11-11 NOTE — ED Triage Notes (Signed)
Pt to ED lobby via w/c with no distress noted, mask in place; reports pt with abd pain since last night

## 2019-11-11 NOTE — ED Notes (Signed)
Pt updated in WR, VS reassessed °

## 2019-11-11 NOTE — ED Triage Notes (Signed)
Pt to ED via [facility] from home for abdominal pain and nausea. Pt states that he is having 2/10 abd pain that feels like he "ate a bunch of green apples". Pt reports nausea without vomiting. Pt has had 1 episode of diarrhea this morning. Pt reports that around 0430 he got up and went to the bathroom and had a syncopal episode. Pt state that his wife told him that he passed out twice. Pt is currently A & O and in NAD.

## 2020-12-08 IMAGING — CR DG CHEST 2V
1 series · 2 of 2 positions shown · non-contrast
Comparison: None.

CLINICAL DATA: Syncope, nausea and vomiting.

EXAM:
CHEST - 2 VIEW

[Series 1: dg chest 2 view · 0.14mm/px · 2 of 2 slices shown]
[im 1/2]
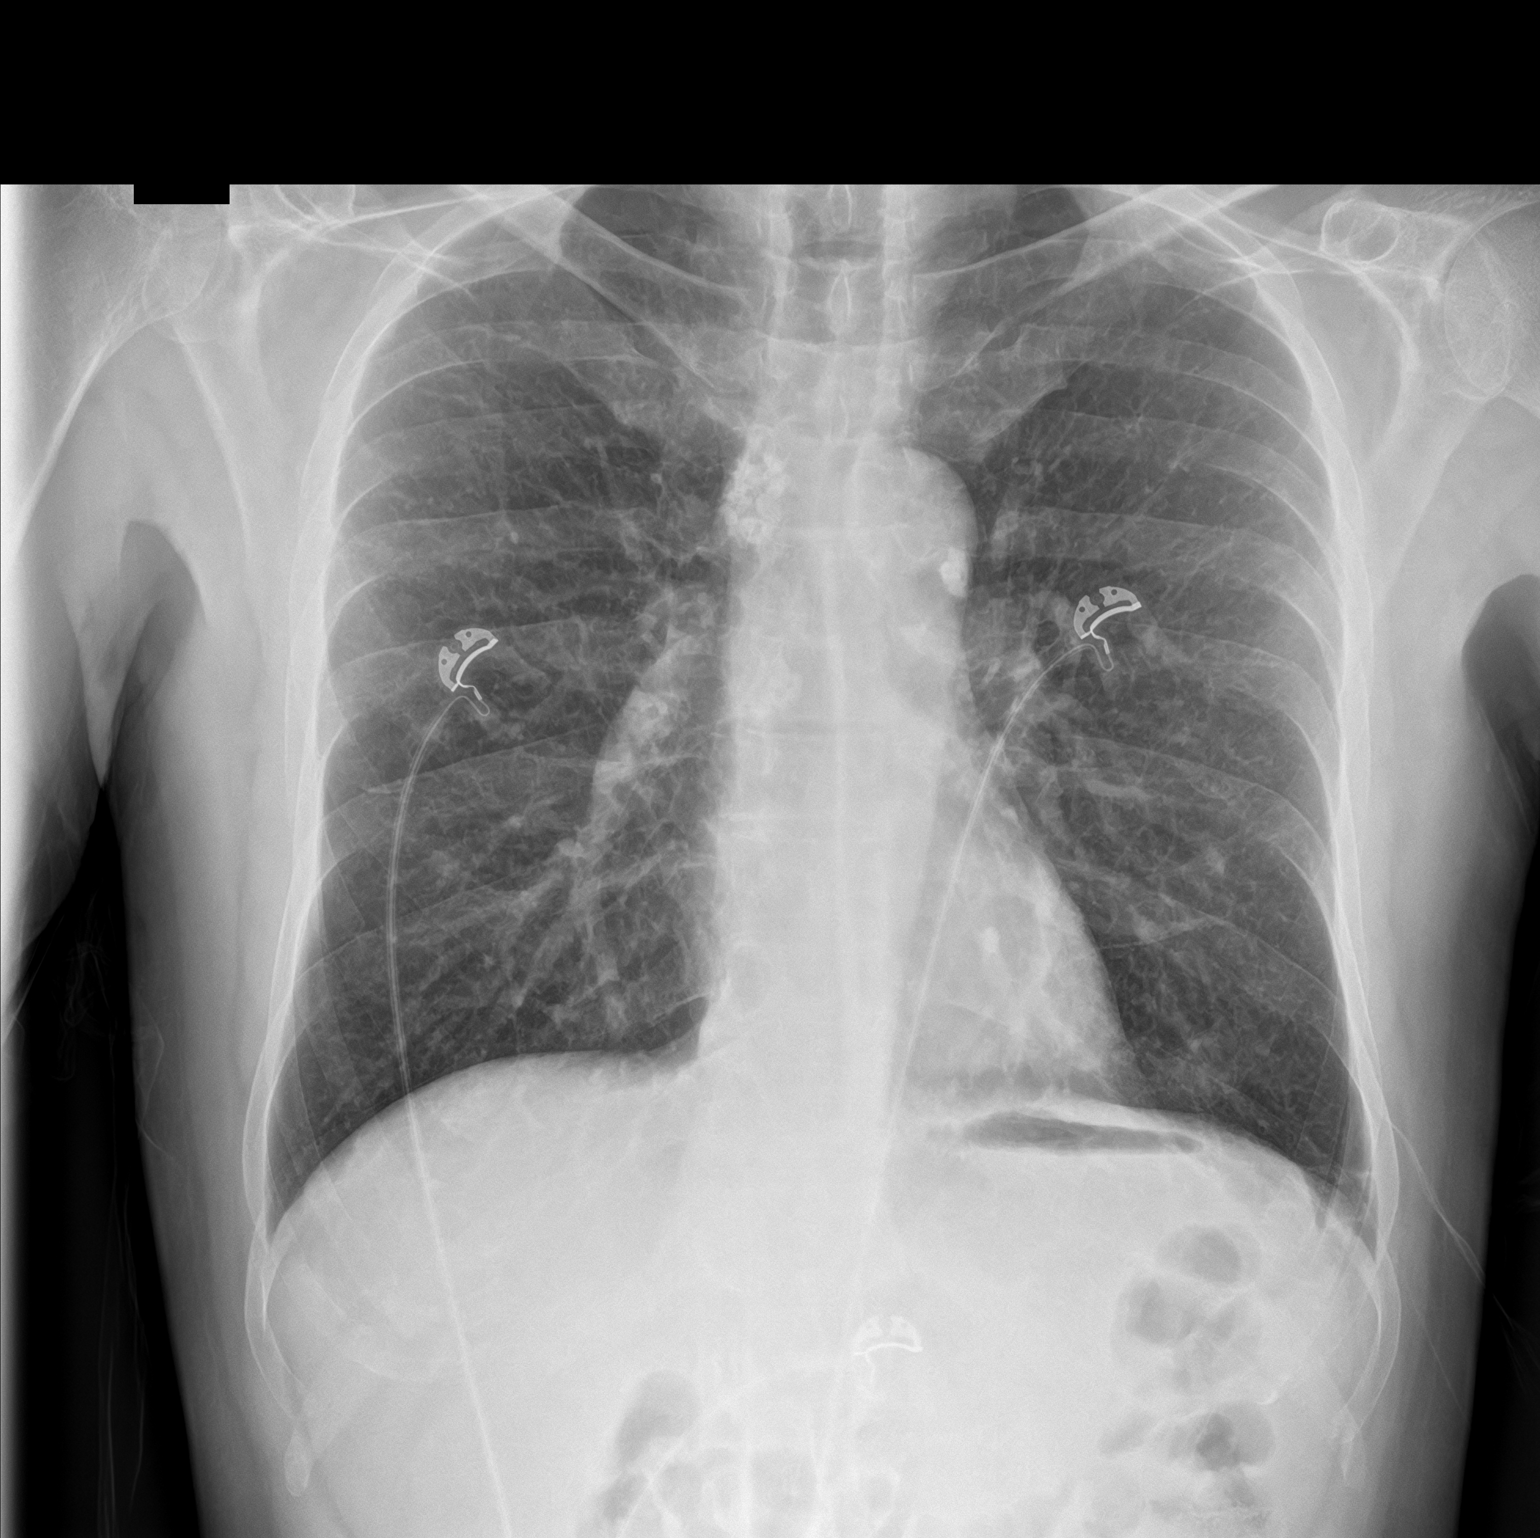
[im 2/2]
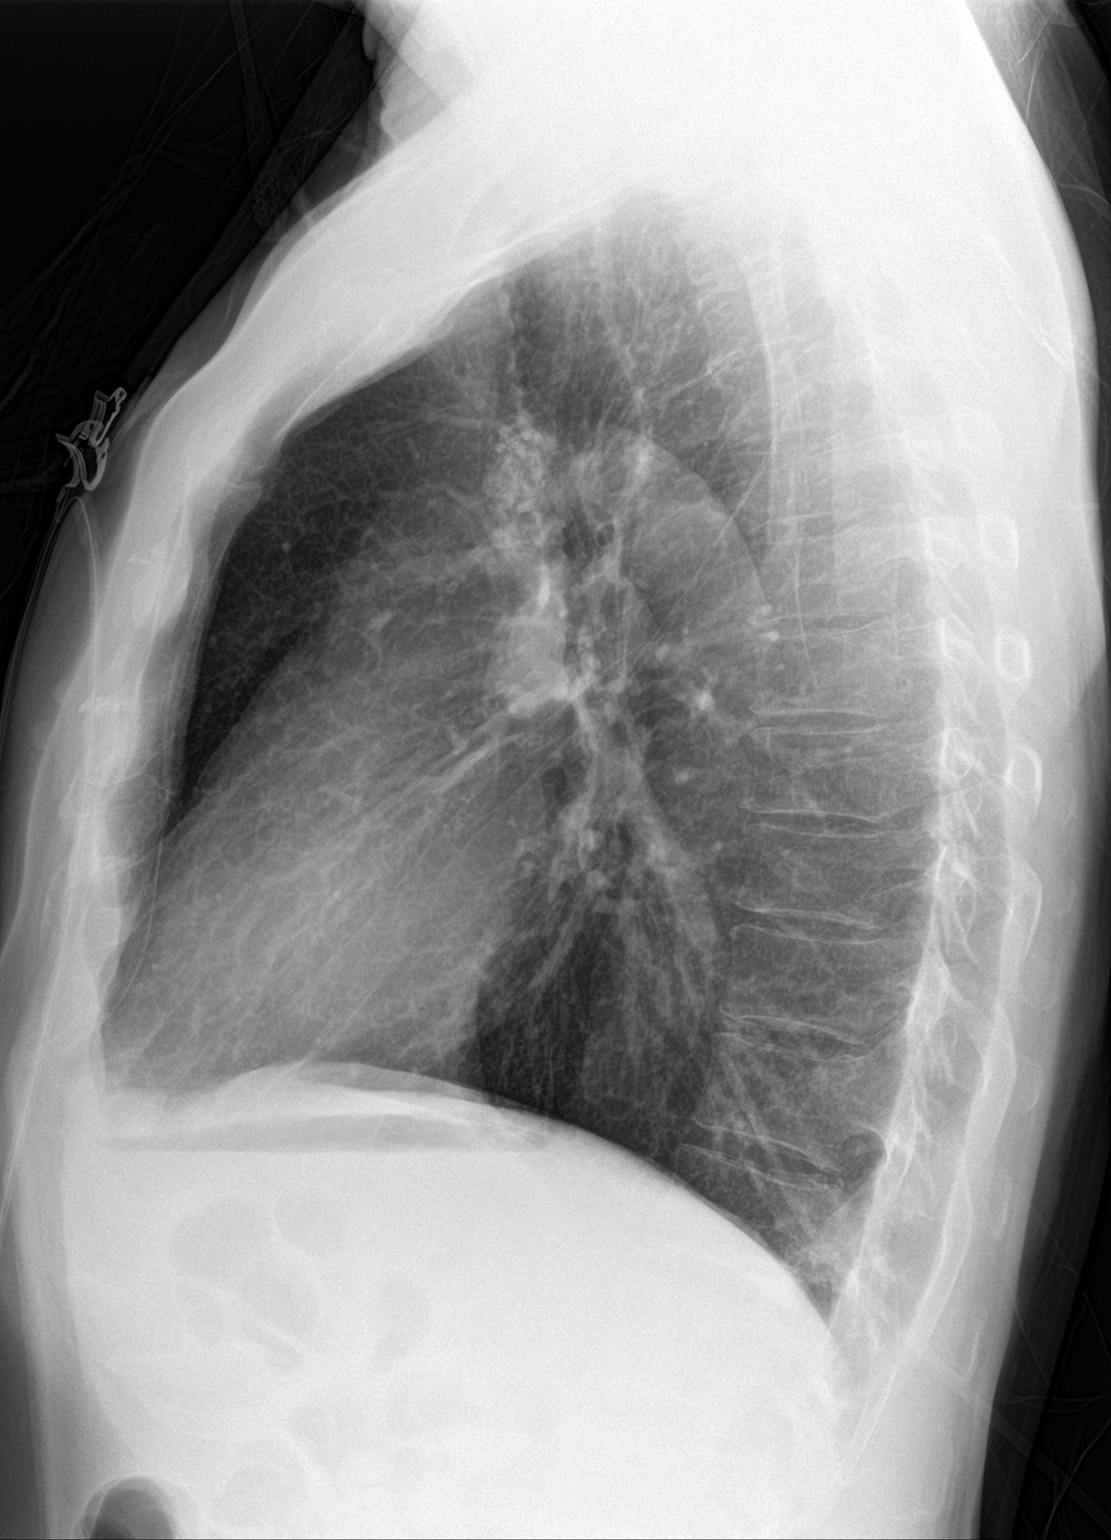

[2 of 2 positions shown; findings below may reference images not displayed]

FINDINGS: The lungs are clear. Heart size is normal. Calcified mediastinal
lymph nodes consistent with old granulomatous disease noted. No
pneumothorax or pleural effusion. No acute or focal bony
abnormality.
IMPRESSION: No acute disease.

## 2023-02-04 ENCOUNTER — Other Ambulatory Visit: Payer: Self-pay | Admitting: Nurse Practitioner

## 2023-02-04 DIAGNOSIS — R0609 Other forms of dyspnea: Secondary | ICD-10-CM

## 2023-02-04 DIAGNOSIS — R079 Chest pain, unspecified: Secondary | ICD-10-CM

## 2023-02-11 ENCOUNTER — Telehealth (HOSPITAL_COMMUNITY): Payer: Self-pay | Admitting: Emergency Medicine

## 2023-02-11 DIAGNOSIS — R079 Chest pain, unspecified: Secondary | ICD-10-CM

## 2023-02-11 MED ORDER — METOPROLOL TARTRATE 50 MG PO TABS
50.0000 mg | ORAL_TABLET | Freq: Once | ORAL | 0 refills | Status: AC
Start: 2023-02-11 — End: 2023-02-11

## 2023-02-11 NOTE — Telephone Encounter (Signed)
Reaching out to patient to offer assistance regarding upcoming cardiac imaging study; pt verbalizes understanding of appt date/time, parking situation and where to check in, pre-test NPO status and medications ordered, and verified current allergies; name and call back number provided for further questions should they arise Rockwell Alexandria RN Navigator Cardiac Imaging [facility] [phone] office [phone] cell  50mg  metoprolol

## 2023-02-12 ENCOUNTER — Ambulatory Visit
Admission: RE | Admit: 2023-02-12 | Discharge: 2023-02-12 | Disposition: A | Discharge status: Home / Self Care | Payer: Medicare HMO | Source: Ambulatory Visit | Attending: Nurse Practitioner | Admitting: Nurse Practitioner

## 2023-02-12 DIAGNOSIS — K7689 Other specified diseases of liver: Secondary | ICD-10-CM | POA: Diagnosis not present

## 2023-02-12 DIAGNOSIS — R0609 Other forms of dyspnea: Secondary | ICD-10-CM | POA: Insufficient documentation

## 2023-02-12 DIAGNOSIS — R079 Chest pain, unspecified: Secondary | ICD-10-CM | POA: Insufficient documentation

## 2023-02-12 DIAGNOSIS — R072 Precordial pain: Secondary | ICD-10-CM

## 2023-02-12 DIAGNOSIS — Z8249 Family history of ischemic heart disease and other diseases of the circulatory system: Secondary | ICD-10-CM | POA: Diagnosis not present

## 2023-02-12 DIAGNOSIS — Z136 Encounter for screening for cardiovascular disorders: Secondary | ICD-10-CM | POA: Insufficient documentation

## 2023-02-12 DIAGNOSIS — Z87891 Personal history of nicotine dependence: Secondary | ICD-10-CM | POA: Diagnosis not present

## 2023-02-12 MED ORDER — SODIUM CHLORIDE 0.9 % IV SOLN: INTRAVENOUS | Status: DC

## 2023-02-12 MED ORDER — NITROGLYCERIN 0.4 MG SL SUBL
0.8000 mg | SUBLINGUAL_TABLET | Freq: Once | SUBLINGUAL | Status: AC
  Administered 2023-02-12: 0.8 mg via SUBLINGUAL

## 2023-02-12 MED ORDER — IOHEXOL 350 MG/ML SOLN
75.0000 mL | Freq: Once | INTRAVENOUS | Status: AC | PRN
  Administered 2023-02-12: 75 mL via INTRAVENOUS

## 2023-02-12 NOTE — Progress Notes (Signed)
Patient tolerated CT well. Drank water after. Vital signs stable encourage to drink water throughout day.Reasons explained and verbalized understanding. Ambulated steady gait.
